# Patient Record
Sex: Male | Born: 2000 | Race: White | Hispanic: No | Marital: Single | Smoking: Never smoker
Health system: Southern US, Community
[De-identification: ages and names within clinical notes are randomized; demographics above are authoritative.]

## PROBLEM LIST (undated history)

## (undated) HISTORY — PX: OTHER SURGICAL HISTORY: SHX169

## (undated) HISTORY — PX: APPENDECTOMY: SHX54

## (undated) HISTORY — PX: TONSILLECTOMY: SUR1361

---
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
## 2019-03-04 IMAGING — MR MR HEAD W/O CM
10 series · 48 of 48 positions shown · non-contrast
Comparison: None.

CLINICAL DATA: Recurrent syncope.  Headache.

EXAM:
MRI HEAD WITHOUT CONTRAST
TECHNIQUE: Multiplanar, multiecho pulse sequences of the brain and surrounding
structures were obtained without intravenous contrast.

[Series 2: T1 · sagittal · 5.0mm · 0.45mm/px · 2 of 29 slices shown (1 of 2)]
[im 1/29]
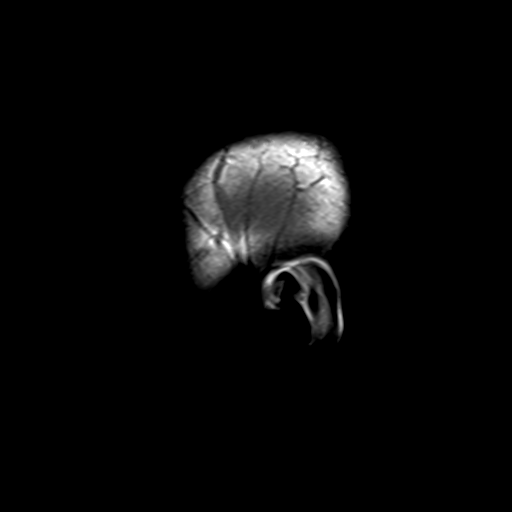
[im 29/29]
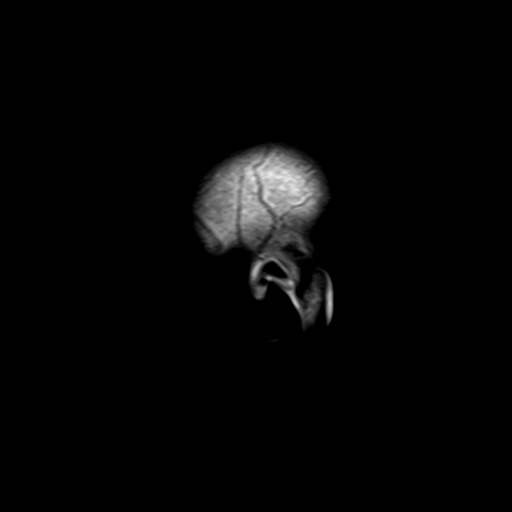

[Series 4: DWI · axial · 3.0mm · 1.80mm/px · z∈[-74,+87]mm · 5 of 55 slices shown (1 of 2)]
[im 1/55]
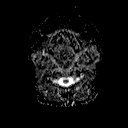
[im 14/55]
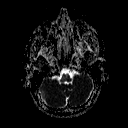
[im 28/55]
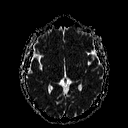
[im 41/55]
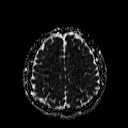
[im 55/55]
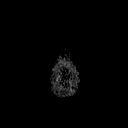

[Series 6: DWI · coronal · 3.0mm · 1.80mm/px · 4 of 47 slices shown (2 of 2)]
[im 1/47]
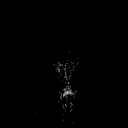
[im 16/47]
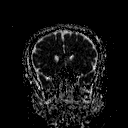
[im 31/47]
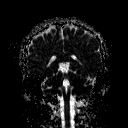
[im 47/47]
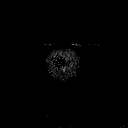

[Series 7: T2 · axial · 5.0mm · 0.60mm/px · z∈[-69,+87]mm · 2 of 25 slices shown (1 of 3)]
[im 1/25]
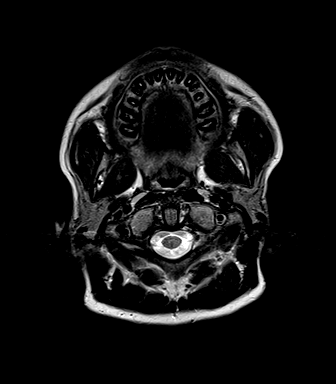
[im 25/25]
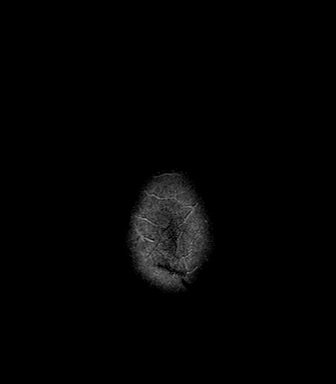

[Series 8: FLAIR · axial · 3.0mm · 0.45mm/px · z∈[-69,+87]mm · 5 of 53 slices shown]
[im 1/53]
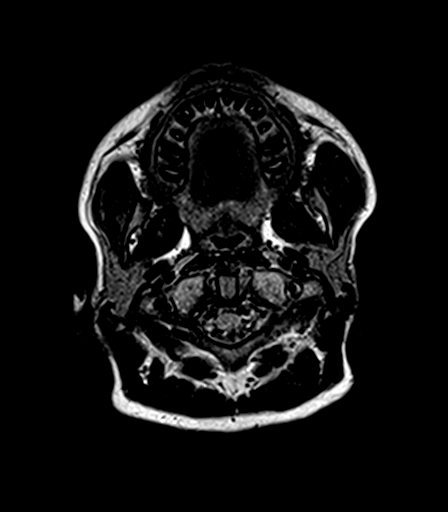
[im 14/53]
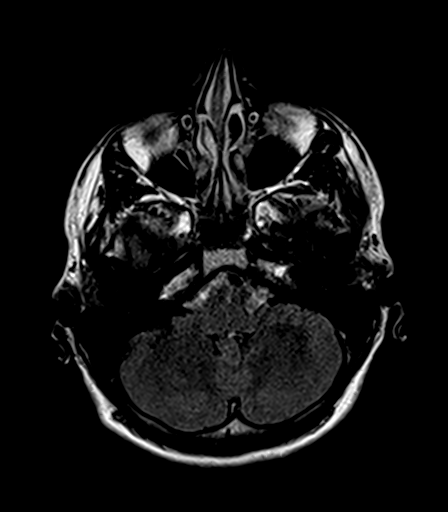
[im 27/53]
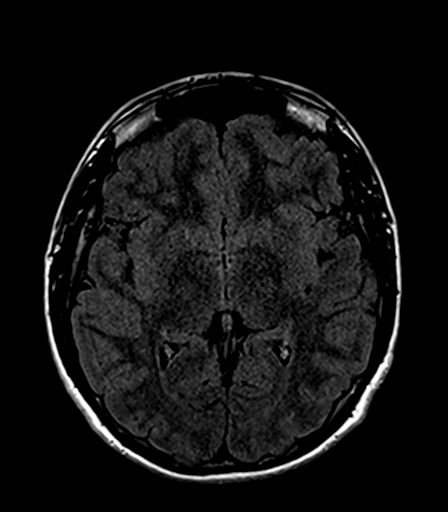
[im 40/53]
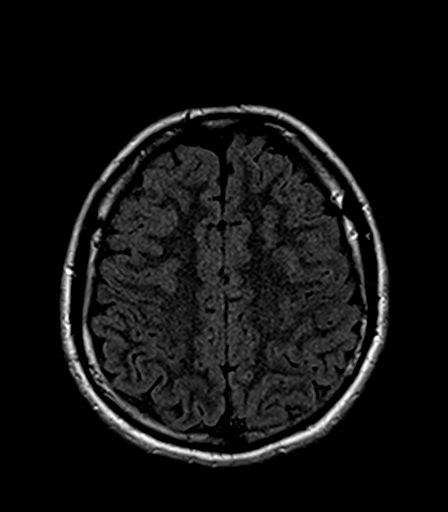
[im 53/53]
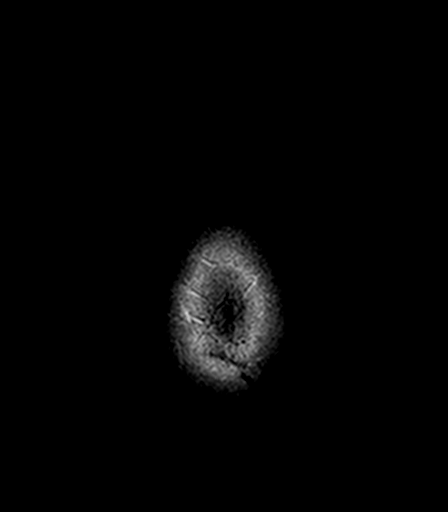

[Series 9: T2 · axial · 5.0mm · 0.45mm/px · z∈[-69,+87]mm · 2 of 25 slices shown (2 of 3)]
[im 1/25]
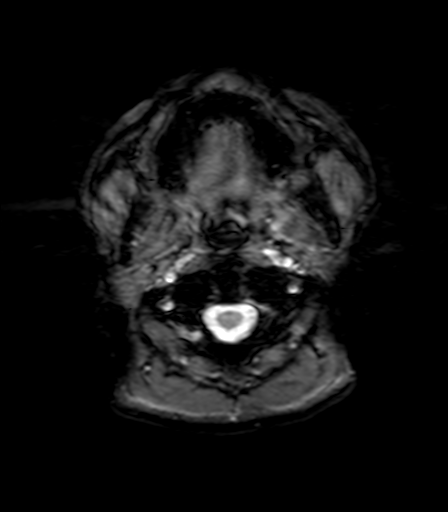
[im 25/25]
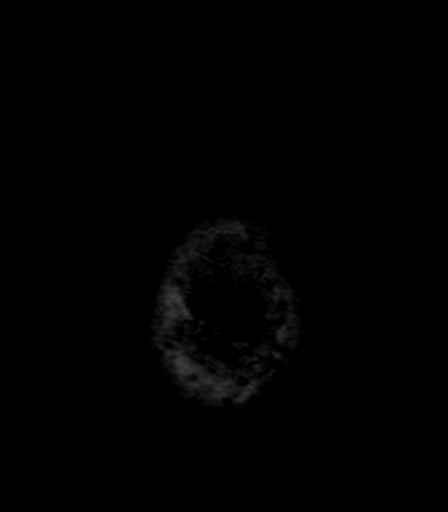

[Series 10: T1 · axial · 1.0mm · 1.00mm/px · z∈[-79,+96]mm · 16 of 176 slices shown (2 of 2)]
[im 1/176]
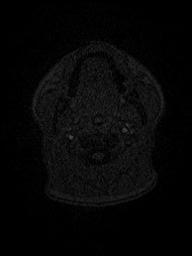
[im 12/176]
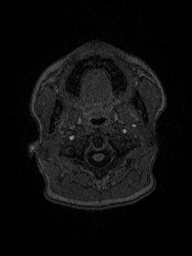
[im 24/176]
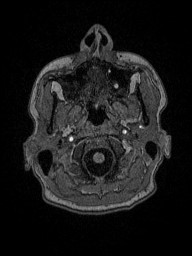
[im 36/176]
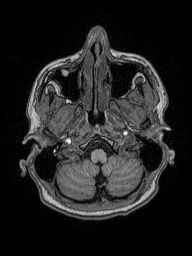
[im 47/176]
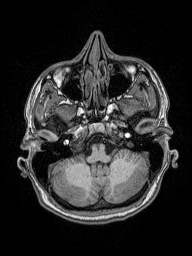
[im 59/176]
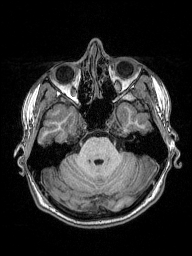
[im 71/176]
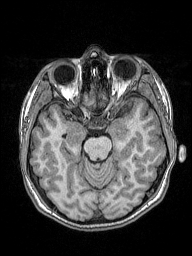
[im 82/176]
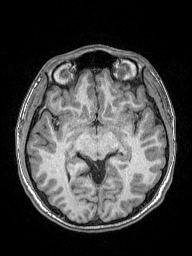
[im 94/176]
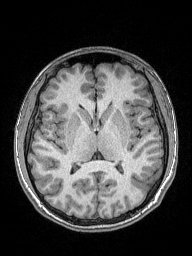
[im 106/176]
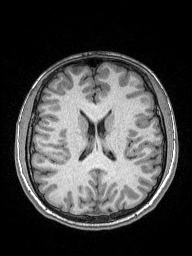
[im 117/176]
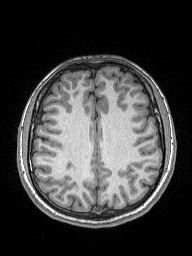
[im 129/176]
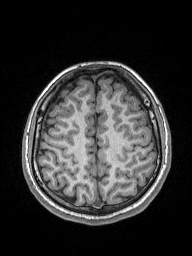
[im 141/176]
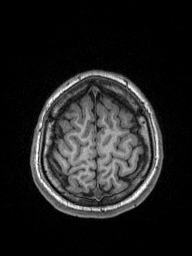
[im 152/176]
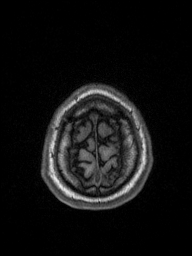
[im 164/176]
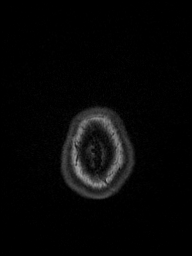
[im 176/176]
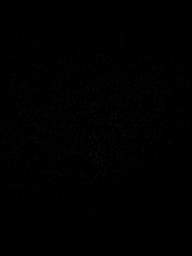

[Series 11: T2 · coronal · 5.0mm · 0.49mm/px · 3 of 29 slices shown (3 of 3)]
[im 1/29]
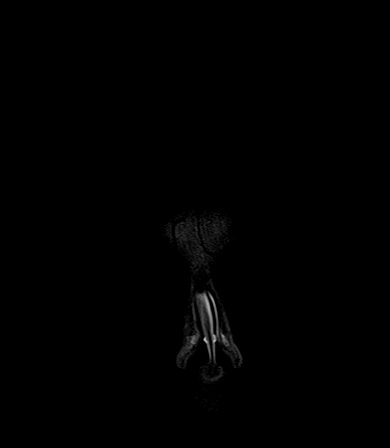
[im 15/29]
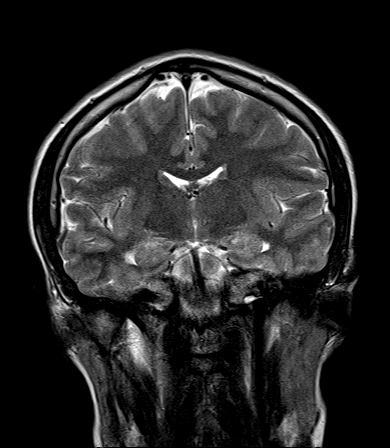
[im 29/29]
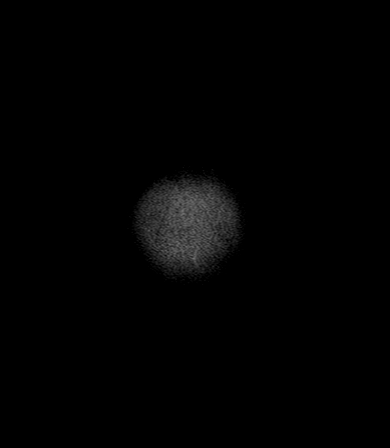

[Series 100: ax (id) · axial · 3.0mm · 1.80mm/px · z∈[-74,+87]mm · 5 of 54 slices shown]
[im 1/54]
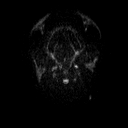
[im 14/54]
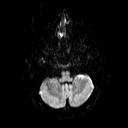
[im 27/54]
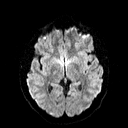
[im 40/54]
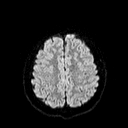
[im 54/54]
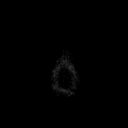

[Series 101: cor (id) · coronal · 3.0mm · 1.80mm/px · 4 of 47 slices shown]
[im 1/47]
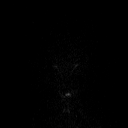
[im 16/47]
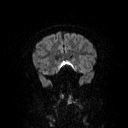
[im 31/47]
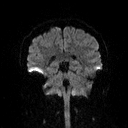
[im 47/47]
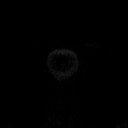

[48 of 48 positions shown; findings below may reference images not displayed]

FINDINGS: BRAIN: The midline structures are normal. There is no acute infarct
or acute hemorrhage. There is no mass lesion or other mass effect.
There is no hydrocephalus, dural abnormality or extra-axial
collection. The white matter signal is normal for the patient's age.
No age-advanced or lobar predominant atrophy. No chronic
microhemorrhage or superficial siderosis.

VASCULAR: Major intracranial arterial and venous sinus flow voids
are preserved.

SKULL AND UPPER CERVICAL SPINE: The visualized skull base,
calvarium, upper cervical spine and extracranial soft tissues are
normal.

SINUSES/ORBITS: Mild maxillary sinus mucosal thickening, right
greater than left. No fluid levels. No mastoid effusion. The orbits
are normal.
IMPRESSION: Normal brain.

## 2021-06-18 ENCOUNTER — Other Ambulatory Visit: Payer: Self-pay

## 2021-06-18 ENCOUNTER — Emergency Department
Admission: EM | Admit: 2021-06-18 | Discharge: 2021-06-18 | Disposition: A | Discharge status: Home / Self Care | Payer: Medicaid Other | Attending: Emergency Medicine | Admitting: Emergency Medicine

## 2021-06-18 DIAGNOSIS — K047 Periapical abscess without sinus: Secondary | ICD-10-CM | POA: Insufficient documentation

## 2021-06-18 DIAGNOSIS — K029 Dental caries, unspecified: Secondary | ICD-10-CM | POA: Insufficient documentation

## 2021-06-18 DIAGNOSIS — Z8619 Personal history of other infectious and parasitic diseases: Secondary | ICD-10-CM | POA: Insufficient documentation

## 2021-06-18 MED ORDER — AMOXICILLIN 875 MG PO TABS: 875.0000 mg | ORAL_TABLET | Freq: Two times a day (BID) | ORAL | 0 refills | Status: DC

## 2021-06-18 MED ORDER — OXYCODONE-ACETAMINOPHEN 7.5-325 MG PO TABS: 1.0000 | ORAL_TABLET | Freq: Four times a day (QID) | ORAL | 0 refills | Status: DC | PRN

## 2021-06-18 MED ORDER — OXYCODONE-ACETAMINOPHEN 5-325 MG PO TABS
1.0000 | ORAL_TABLET | Freq: Once | ORAL | Status: AC
  Administered 2021-06-18: 1 via ORAL
  Filled 2021-06-18: qty 1

## 2021-06-18 MED ORDER — LIDOCAINE VISCOUS HCL 2 % MT SOLN
15.0000 mL | Freq: Once | OROMUCOSAL | Status: AC
  Administered 2021-06-18: 15 mL via OROMUCOSAL
  Filled 2021-06-18: qty 15

## 2021-06-18 MED ORDER — NAPROXEN 500 MG PO TABS: 500.0000 mg | ORAL_TABLET | Freq: Two times a day (BID) | ORAL | Status: DC

## 2021-06-18 MED ORDER — IBUPROFEN 600 MG PO TABS
600.0000 mg | ORAL_TABLET | Freq: Once | ORAL | Status: AC
  Administered 2021-06-18: 600 mg via ORAL
  Filled 2021-06-18: qty 1

## 2021-06-18 NOTE — ED Provider Notes (Signed)
[facility] Emergency Department Provider Note   ____________________________________________   Event Date/Time   First MD Initiated Contact with Patient 06/18/21 1241     (approximate)  I have reviewed the triage vital signs and the nursing notes.   HISTORY  Chief Complaint Jaw Pain    HPI Matthew Cobb is a 20 y.o. male patient complain of right lower jaw pain for 3 days.  Patient states he has a dental appointment in 5 days.  Patient has a history of dental abscess and devitalized teeth.  Patient rates the pain as 8/10.  Patient described pain as "achy".  No relief with over-the-counter medications.         History reviewed. No pertinent past medical history.  There are no problems to display for this patient.   Past Surgical History:  Procedure Laterality Date   APPENDECTOMY     arm surgery     TONSILLECTOMY      Prior to Admission medications   Medication Sig Start Date End Date Taking? Authorizing Provider  amoxicillin (AMOXIL) 875 MG tablet Take 1 tablet (875 mg total) by mouth 2 (two) times daily. 06/18/21  Yes Joni Reining, PA-C  naproxen (NAPROSYN) 500 MG tablet Take 1 tablet (500 mg total) by mouth 2 (two) times daily with a meal. 06/18/21  Yes Joni Reining, PA-C  oxyCODONE-acetaminophen (PERCOCET) 7.5-325 MG tablet Take 1 tablet by mouth every 6 (six) hours as needed for severe pain. 06/18/21  Yes Joni Reining, PA-C    Allergies Patient has no known allergies.  No family history on file.  Social History Social History   Tobacco Use   Smoking status: Never   Smokeless tobacco: Never  Substance Use Topics   Alcohol use: No   Drug use: No    Review of Systems Constitutional: No fever/chills Eyes: No visual changes. ENT: No sore throat.  Right lower dental pain Cardiovascular: Denies chest pain. Respiratory: Denies shortness of breath. Gastrointestinal: No abdominal pain.  No nausea, no vomiting.  No  diarrhea.  No constipation. Genitourinary: Negative for dysuria. Musculoskeletal: Negative for back pain. Skin: Negative for rash. Neurological: Negative for headaches, focal weakness or numbness.   ____________________________________________   PHYSICAL EXAM:  VITAL SIGNS: ED Triage Vitals [06/18/21 1210]  Enc Vitals Group     BP 126/83     Pulse Rate 98     Resp 15     Temp 99.2 F (37.3 C)     Temp Source Oral     SpO2 97 %     Weight 131 lb 12.8 oz (59.8 kg)     Height 5\' 11"  (1.803 m)     Head Circumference      Peak Flow      Pain Score 8     Pain Loc      Pain Edu?      Excl. in GC?    Constitutional: Alert and oriented. Well appearing and in no acute distress. Eyes: Conjunctivae are normal. PERRL. EOMI. Head: Atraumatic. Nose: No congestion/rhinnorhea. Mouth/Throat: Mucous membranes are moist.  Oropharynx non-erythematous.  Multiple devitalized right lower molars with gingivitis. Neck: No stridor.  Hematological/Lymphatic/Immunilogical: No cervical lymphadenopathy. Cardiovascular: Normal rate, regular rhythm. Grossly normal heart sounds.  Good peripheral circulation. Respiratory: Normal respiratory effort.  No retractions. Lungs CTAB. Neurologic:  Normal speech and language. No gross focal neurologic deficits are appreciated. No gait instability. Skin:  Skin is warm, dry and intact. No rash noted. Psychiatric:  Mood and affect are normal. Speech and behavior are normal.  ____________________________________________   LABS (all labs ordered are listed, but only abnormal results are displayed)  Labs Reviewed - No data to display ____________________________________________  EKG   ____________________________________________  RADIOLOGY I, Joni Reining, personally viewed and evaluated these images (plain radiographs) as part of my medical decision making, as well as reviewing the written report by the radiologist.  ED MD interpretation:    Official  radiology report(s): No results found.  ____________________________________________   PROCEDURES  Procedure(s) performed (including Critical Care):  Procedures   ____________________________________________   INITIAL IMPRESSION / ASSESSMENT AND PLAN / ED COURSE  As part of my medical decision making, I reviewed the following data within the electronic MEDICAL RECORD NUMBER         Patient presents with right lateral facial edema secondary to dental infection.  Patient given discharge care instruction advised take medication as directed.  Patient has a scheduled dental appointment next week.  Return to ED if condition worsens.      ____________________________________________   FINAL CLINICAL IMPRESSION(S) / ED DIAGNOSES  Final diagnoses:  Pain due to dental caries     ED Discharge Orders          Ordered    amoxicillin (AMOXIL) 875 MG tablet  2 times daily        06/18/21 1304    naproxen (NAPROSYN) 500 MG tablet  2 times daily with meals        06/18/21 1304    oxyCODONE-acetaminophen (PERCOCET) 7.5-325 MG tablet  Every 6 hours PRN        06/18/21 1304             Note:  This document was prepared using Dragon voice recognition software and may include unintentional dictation errors.    Joni Reining, PA-C 06/18/21 1308    [place]ton [place], MD 06/18/21 1329

## 2021-06-18 NOTE — ED Triage Notes (Signed)
Pt to ER via POV with complaints of right sided lower jaw pain. Reports possibly having a dental abscess. Has been having pain for over a year but the last week the pain has worsened. No other complaints.

## 2021-06-18 NOTE — Discharge Instructions (Addendum)
Read and follow discharge care instruction.  Follow-up with scheduled dental appointment.

## 2022-05-08 ENCOUNTER — Emergency Department
Admission: EM | Admit: 2022-05-08 | Discharge: 2022-05-08 | Disposition: A | Discharge status: Home / Self Care | Payer: Self-pay | Attending: Emergency Medicine | Admitting: Emergency Medicine

## 2022-05-08 ENCOUNTER — Other Ambulatory Visit: Payer: Self-pay

## 2022-05-08 DIAGNOSIS — K047 Periapical abscess without sinus: Secondary | ICD-10-CM | POA: Insufficient documentation

## 2022-05-08 MED ORDER — AMOXICILLIN 500 MG PO TABS: 500.0000 mg | ORAL_TABLET | Freq: Three times a day (TID) | ORAL | 0 refills | Status: AC

## 2022-05-08 NOTE — ED Triage Notes (Addendum)
Pt comes with c/o dental pain on left side. Pt states this started year ago the patient states abscess .

## 2022-05-08 NOTE — ED Provider Notes (Signed)
   [facility] Provider Note    Event Date/Time   First MD Initiated Contact with Patient 05/08/22 1442     (approximate)   History   Dental Pain   HPI  Matthew Cobb is a 21 y.o. male presents to the emergency department for treatment and evaluation of dental pain on the left upper molar area.  Symptoms have been intermittent for approximately 1 year.  No fever.  History reviewed. No pertinent past medical history.   Physical Exam   Triage Vital Signs: ED Triage Vitals  Enc Vitals Group     BP 05/08/22 1259 111/70     Pulse Rate 05/08/22 1259 (!) 50     Resp 05/08/22 1259 19     Temp 05/08/22 1259 98.3 F (36.8 C)     Temp src --      SpO2 05/08/22 1259 98 %     Weight 05/08/22 1407 131 lb 13.4 oz (59.8 kg)     Height 05/08/22 1407 5\' 11"  (1.803 m)     Head Circumference --      Peak Flow --      Pain Score 05/08/22 1257 5     Pain Loc --      Pain Edu? --      Excl. in GC? --     Most recent vital signs: Vitals:   05/08/22 1259  BP: 111/70  Pulse: (!) 50  Resp: 19  Temp: 98.3 F (36.8 C)  SpO2: 98%    General: Awake, no distress.  CV:  Good peripheral perfusion.  Resp:  Normal effort.  Abd:  No distention.  Other:  Left upper molar with obvious decay and associated facial swelling without area of fluctuance.   ED Results / Procedures / Treatments   Labs (all labs ordered are listed, but only abnormal results are displayed) Labs Reviewed - No data to display   EKG     RADIOLOGY  Not indicated  I have independently reviewed and interpreted imaging as well as reviewed report from radiology.  PROCEDURES:  Critical Care performed: No  Procedures   MEDICATIONS ORDERED IN ED:  Medications - No data to display   IMPRESSION / MDM / ASSESSMENT AND PLAN / ED COURSE   I reviewed the triage vital signs and the nursing notes.  Differential diagnosis includes, but is not limited to: Dental caries, dental  abscess, facial cellulitis  Patient's presentation is most consistent with acute, uncomplicated illness.  21 year old male presenting to the emergency department for treatment and evaluation of dental pain and facial swelling.  See HPI for further details.  He will be treated with amoxicillin and encouraged to see dentist within the next 2 weeks.  If symptoms change or worsen and he is unable to see a dentist, he is to return to the emergency department.      FINAL CLINICAL IMPRESSION(S) / ED DIAGNOSES   Final diagnoses:  Dental abscess     Rx / DC Orders   ED Discharge Orders          Ordered    amoxicillin (AMOXIL) 500 MG tablet  3 times daily        05/08/22 1456             Note:  This document was prepared using Dragon voice recognition software and may include unintentional dictation errors.   07/09/22, FNP 05/13/22 1231    07/14/22, MD 05/14/22 0000

## 2022-05-08 NOTE — Discharge Instructions (Signed)
Please call and schedule a dental appointment as soon as possible. You will need to be seen within the next 14 days. Return to the emergency department for symptoms that change or worsen if you're unable to schedule an appointment.  OPTIONS FOR DENTAL FOLLOW UP CARE  [place] [facility] and [facility] - Local Safety Net Dental Clinics http://www.ncdhhs.gov/dph/oralhealth/services/safetynetclinics.htm   [facility] ([phone])  [facility] ([phone])  [facility] ([phone])  [facility] ([phone])  [facility] ([phone]) This clinic caters to the indigent population and is on a lottery system. Location: [facility], [address], [place] Clinic Hours: Wednesdays from 6pm - 9pm, patients seen by a lottery system. For dates, call or go to [email or web address]/[facility]/[email or web address] Services: Cleanings, fillings and simple extractions. Payment Options: DENTAL WORK IS FREE OF CHARGE. Bring proof of income or support. Best way to get seen: Arrive at 5:15 pm - this is a lottery, NOT first come/first serve, so arriving earlier will not increase your chances of being seen.     [facility] [phone] Select option 1 for emergencies   Location: [facility], [address], [place] Clinic Hours: No walk-ins accepted - call the day before to schedule an appointment. Check in times are 9:30 am and 1:30 pm. Services: Simple extractions, temporary fillings, pulpectomy/pulp debridement, uncomplicated abscess drainage. Payment Options: PAYMENT IS DUE AT THE TIME OF SERVICE.  Fee is usually $100-200, additional surgical procedures (e.g. abscess drainage) may be extra. Cash, checks, Visa/MasterCard accepted.  Can file Medicaid if patient is covered for dental - patient should call case worker to check. No  discount for [facility] patients. Best way to get seen: MUST call the day before and get onto the schedule. Can usually be seen the next 1-2 days. No walk-ins accepted.     [facility] [phone]   Location: [facility], [address], [place] Clinic Hours: M, W, Th, F 8am or 1:30pm, Tues 9a or 1:30 - first come/first served. Services: Simple extractions, temporary fillings, uncomplicated abscess drainage.  You do not need to be an [place] resident. Payment Options: PAYMENT IS DUE AT THE TIME OF SERVICE. Dental insurance, otherwise sliding scale - bring proof of income or support. Depending on income and treatment needed, cost is usually $50-200. Best way to get seen: Arrive early as it is first come/first served.     [facility] [phone]   Location: [address] Clinic Hours: Mon-Thu 8a-5p Services: Most basic dental services including extractions and fillings. Payment Options: PAYMENT IS DUE AT THE TIME OF SERVICE. Sliding scale, up to 50% off - bring proof if income or support. Medicaid with dental option accepted. Best way to get seen: Call to schedule an appointment, can usually be seen within 2 weeks OR they will try to see walk-ins - show up at 8a or 2p (you may have to wait).     [facility] [phone] [place] RESIDENTS ONLY   Location: [facility], [address], [place], [place] [address] Clinic Hours: By appointment only. Monday - Thursday 8am-5pm, Friday 8am-12pm Services: Cleanings, fillings, extractions. Payment Options: PAYMENT IS DUE AT THE TIME OF SERVICE. Cash, Visa or MasterCard. Sliding scale - $30 minimum per service. Best way to get seen: Come in to office, complete packet and make an appointment -   need proof of income or support monies for each household member and proof of [place]  residence. Usually takes about a month to get in.     [facility] [phone]   Location: [address] [place] Clinic Hours: Walk-in Urgent Care Dental Services are offered Monday-Friday mornings only. The numbers of emergencies accepted daily is limited to the number of providers available. Maximum 15 - Mondays, Wednesdays & Thursdays Maximum 10 - Tuesdays & Fridays Services: You do not need to be a [place] resident to be seen for a dental emergency. Emergencies are defined as pain, swelling, abnormal bleeding, or dental trauma. Walkins will receive x-rays if needed. NOTE: Dental cleaning is not an emergency. Payment Options: PAYMENT IS DUE AT THE TIME OF SERVICE. Minimum co-pay is $40.00 for uninsured patients. Minimum co-pay is $3.00 for Medicaid with dental coverage. Dental Insurance is accepted and must be presented at time of visit. Medicare does not cover dental. Forms of payment: Cash, credit card, checks. Best way to get seen: If not previously registered with the clinic, walk-in dental registration begins at 7:15 am and is on a first come/first serve basis. If previously registered with the clinic, call to make an appointment.     [facility] [phone] [place] RESIDENTS ONLY   Location: [address], [place], [place] Clinic Hours: Mon-Thu 10a-2p Services: Extractions only! Payment Options: FREE (donations accepted) - bring proof of income or support Best way to get seen: Call and schedule an appointment OR come at 8am on the 1st Monday of every month (except for holidays) when it is first come/first served.     [facility] [phone]   Location: [address], [place] Clinic Hours: Friday mornings Services, Payment Options, Best way to get seen: Call for info
